# Patient Record
Sex: Male | Born: 1995 | Race: Black or African American | Hispanic: No | Marital: Single | Smoking: Never smoker
Health system: Southern US, Community
[De-identification: ages and names within clinical notes are randomized; demographics above are authoritative.]

## PROBLEM LIST (undated history)

## (undated) HISTORY — PX: KNEE SURGERY: SHX244

## (undated) HISTORY — PX: ANTERIOR CRUCIATE LIGAMENT REPAIR: SHX115

---
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
## 2015-06-08 ENCOUNTER — Emergency Department (HOSPITAL_COMMUNITY)
Admission: EM | Admit: 2015-06-08 | Discharge: 2015-06-08 | Disposition: A | Discharge status: Home / Self Care | Payer: Worker's Compensation | Attending: Emergency Medicine | Admitting: Emergency Medicine

## 2015-06-08 ENCOUNTER — Encounter (HOSPITAL_COMMUNITY): Payer: Self-pay | Admitting: *Deleted

## 2015-06-08 DIAGNOSIS — R2240 Localized swelling, mass and lump, unspecified lower limb: Secondary | ICD-10-CM | POA: Diagnosis not present

## 2015-06-08 DIAGNOSIS — M79672 Pain in left foot: Secondary | ICD-10-CM | POA: Diagnosis not present

## 2015-06-08 DIAGNOSIS — M25562 Pain in left knee: Secondary | ICD-10-CM | POA: Insufficient documentation

## 2015-06-08 MED ORDER — OXYCODONE-ACETAMINOPHEN 5-325 MG PO TABS: 1.0000 | ORAL_TABLET | ORAL | Status: AC | PRN
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
## 2015-06-08 NOTE — ED Notes (Signed)
To ED for eval of left knee pain. Pt had knee surgery on Thursday. Dr Janee Mornhompson instructed pt to return for eval of increased pain in knee, left foot and ankle swelling. Pt denies trauma

## 2015-06-08 NOTE — ED Notes (Signed)
Dr Grandville Silos met pt in triage with a pain med script and informed staff pt was not to have checked in .

## 2015-12-03 ENCOUNTER — Encounter (HOSPITAL_COMMUNITY): Payer: Self-pay | Admitting: Emergency Medicine

## 2015-12-03 ENCOUNTER — Emergency Department (HOSPITAL_COMMUNITY)
Admission: EM | Admit: 2015-12-03 | Discharge: 2015-12-03 | Disposition: A | Discharge status: Home / Self Care | Payer: Self-pay | Attending: Emergency Medicine | Admitting: Emergency Medicine

## 2015-12-03 ENCOUNTER — Emergency Department (HOSPITAL_COMMUNITY): Payer: Self-pay

## 2015-12-03 DIAGNOSIS — Y999 Unspecified external cause status: Secondary | ICD-10-CM | POA: Insufficient documentation

## 2015-12-03 DIAGNOSIS — Z79899 Other long term (current) drug therapy: Secondary | ICD-10-CM | POA: Insufficient documentation

## 2015-12-03 DIAGNOSIS — S40019A Contusion of unspecified shoulder, initial encounter: Secondary | ICD-10-CM

## 2015-12-03 DIAGNOSIS — S40012A Contusion of left shoulder, initial encounter: Secondary | ICD-10-CM | POA: Insufficient documentation

## 2015-12-03 DIAGNOSIS — Y929 Unspecified place or not applicable: Secondary | ICD-10-CM | POA: Insufficient documentation

## 2015-12-03 DIAGNOSIS — S40011A Contusion of right shoulder, initial encounter: Secondary | ICD-10-CM | POA: Insufficient documentation

## 2015-12-03 DIAGNOSIS — Y939 Activity, unspecified: Secondary | ICD-10-CM | POA: Insufficient documentation

## 2015-12-03 DIAGNOSIS — W109XXA Fall (on) (from) unspecified stairs and steps, initial encounter: Secondary | ICD-10-CM | POA: Insufficient documentation

## 2015-12-03 MED ORDER — HYDROCODONE-ACETAMINOPHEN 5-325 MG PO TABS: 1.0000 | ORAL_TABLET | ORAL | 0 refills | Status: AC | PRN

## 2015-12-03 NOTE — ED Provider Notes (Signed)
[facility]-EMERGENCY DEPT Provider Note   CSN: [id number] Arrival date & time: 12/03/15  1647     History   Chief Complaint Chief Complaint  Patient presents with  . Shoulder Pain    HPI Phillip Bradford is a 20 y.o. right handed male , in the [employer], presenting with bilateral shoulder pain since falling down 8-10 steps early yesterday morning when getting ready for his day.  He describes falling head first, "tucked and rolled" hitting bilateral shoulders on the way down but denies headache, neck pain or other injury.  Pain is constant in his shoulders, right greater than left and worsened with attempts to raise his arms over shoulder height level. He has noticed waking this morning with tingling in his right finger tips which resolved within minutes of waking and has not returned. He was seen at an urgent care center and was diagnosed with bursitis of his shoulder.  He was prescribed naproxen which has not relieved his pain.    Shoulder Pain   Pertinent negatives include no numbness.    History reviewed. No pertinent past medical history.  There are no active problems to display for this patient.   Past Surgical History:  Procedure Laterality Date  . ANTERIOR CRUCIATE LIGAMENT REPAIR    . KNEE SURGERY         Home Medications    Prior to Admission medications   Medication Sig Start Date End Date Taking? Authorizing Provider  HYDROcodone-acetaminophen (NORCO/VICODIN) 5-325 MG tablet Take 1 tablet by mouth every 4 (four) hours as needed. 12/03/15   Burgess Amor, PA-C  naphazoline-pheniramine (NAPHCON-A) 0.025-0.3 % ophthalmic solution Place 1-2 drops into both eyes 4 (four) times daily as needed. For irritation of eyes     Historical Provider, MD  oxyCODONE-acetaminophen (ROXICET) 5-325 MG tablet Take 1-3 tablets by mouth every 4 (four) hours as needed for severe pain. 06/08/15   Mack Hook, MD    Family History History reviewed. No pertinent family history.  Social  History Social History  Substance Use Topics  . Smoking status: Never Smoker  . Smokeless tobacco: Not on file  . Alcohol use No     Allergies   Review of patient's allergies indicates no known allergies.   Review of Systems Review of Systems  Constitutional: Negative for fever.  Musculoskeletal: Positive for arthralgias. Negative for joint swelling and myalgias.  Neurological: Negative for weakness and numbness.     Physical Exam Updated Vital Signs BP 175/95 (BP Location: Left Arm)   Pulse 75   Temp 98.3 F (36.8 C) (Oral)   Resp 18   Ht 5\' 9"  (1.753 m)   Wt 85.3 kg   SpO2 100%   BMI 27.76 kg/m   Physical Exam  Constitutional: He appears well-developed and well-nourished.  HENT:  Head: Atraumatic.  Neck: Normal range of motion.  Cardiovascular:  Pulses equal bilaterally  Musculoskeletal: He exhibits tenderness.       Right shoulder: He exhibits decreased range of motion and bony tenderness. He exhibits no swelling, no effusion, no crepitus, no deformity, normal pulse and normal strength.       Left shoulder: He exhibits bony tenderness. He exhibits no deformity, no spasm, normal pulse and normal strength.  Equal grip strength.  Sensation normal in bilateral arms.  ttp bilateral lateral shoulders.  nontender clavicles and scapulae.   No visible deformity.  Pt has full strength with forearm flex/ext.   Neurological: He is alert. He has normal strength. He displays normal  reflexes. No sensory deficit.  Reflex Scores:      Bicep reflexes are 2+ on the right side and 2+ on the left side. Equal grip strength  Skin: Skin is warm and dry.  Psychiatric: He has a normal mood and affect.     ED Treatments / Results  Labs (all labs ordered are listed, but only abnormal results are displayed) Labs Reviewed - No data to display  EKG  EKG Interpretation None       Radiology Dg Shoulder Right  Result Date: 12/03/2015 CLINICAL DATA:  Bilateral shoulder pain.   Recent fall. EXAM: RIGHT SHOULDER - 2+ VIEW COMPARISON:  Left shoulder 12/03/2015 FINDINGS: There is no evidence of fracture or dislocation. There is no evidence of arthropathy or other focal bone abnormality. Soft tissues are unremarkable. IMPRESSION: Negative. Electronically Signed   By: Richarda OverlieAdam  Henn M.D.   On: 12/03/2015 18:18   Dg Shoulder Left  Result Date: 12/03/2015 CLINICAL DATA:  Pain just over 1 week ago. EXAM: LEFT SHOULDER - 2+ VIEW COMPARISON:  None. FINDINGS: There is no evidence of fracture or dislocation. There is no evidence of arthropathy or other focal bone abnormality. Soft tissues are unremarkable. IMPRESSION: Negative. Electronically Signed   By: Gerome Samavid  Eifert III M.D   On: 12/03/2015 18:18    Procedures Procedures (including critical care time)  Medications Ordered in ED Medications - No data to display   Initial Impression / Assessment and Plan / ED Course  I have reviewed the triage vital signs and the nursing notes.  Pertinent labs & imaging results that were available during my care of the patient were reviewed by me and considered in my medical decision making (see chart for details).  Clinical Course    Imaging reviewed and discussed with pt.  Suspect contusion/soft tissue injury.  He was advised to use ice tx, adding heat on day 3.  Sling prn.  Hydrocodone. F/u with Dr. Luiz BlareGraves prn if sx not improving over the next 7-10 days.  Final Clinical Impressions(s) / ED Diagnoses   Final diagnoses:  Shoulder contusion, unspecified laterality, initial encounter    New Prescriptions New Prescriptions   HYDROCODONE-ACETAMINOPHEN (NORCO/VICODIN) 5-325 MG TABLET    Take 1 tablet by mouth every 4 (four) hours as needed.     Burgess AmorJulie Kayana Thoen, PA-C 12/03/15 1857    Shaune Pollackameron Isaacs, MD 12/04/15 [phone]

## 2015-12-03 NOTE — Discharge Instructions (Signed)
Apply ice packs to your shoulders as much as possible for the next several days (5 minutes every hour while awake would be a good schedule).  You may add heat therapy starting on Thursday for 20 minutes twice daily.  Continue using your naproxen.  You may take the hydrocodone prescribed for pain relief.  This will make you drowsy - do not drive within 4 hours of taking this medication.  Wear the sling for comfort as discussed.

## 2015-12-03 NOTE — ED Triage Notes (Signed)
[facility].  Pt injured shoulders last Monday after falling. Pt had dx bursitis in right shoulder.

## 2017-11-09 IMAGING — DX DG SHOULDER 2+V*L*
3 series · 4 of 4 positions shown · non-contrast
Comparison: None.

CLINICAL DATA: Pain just over 1 week ago.

EXAM:
LEFT SHOULDER - 2+ VIEW

[shoulder grashey]
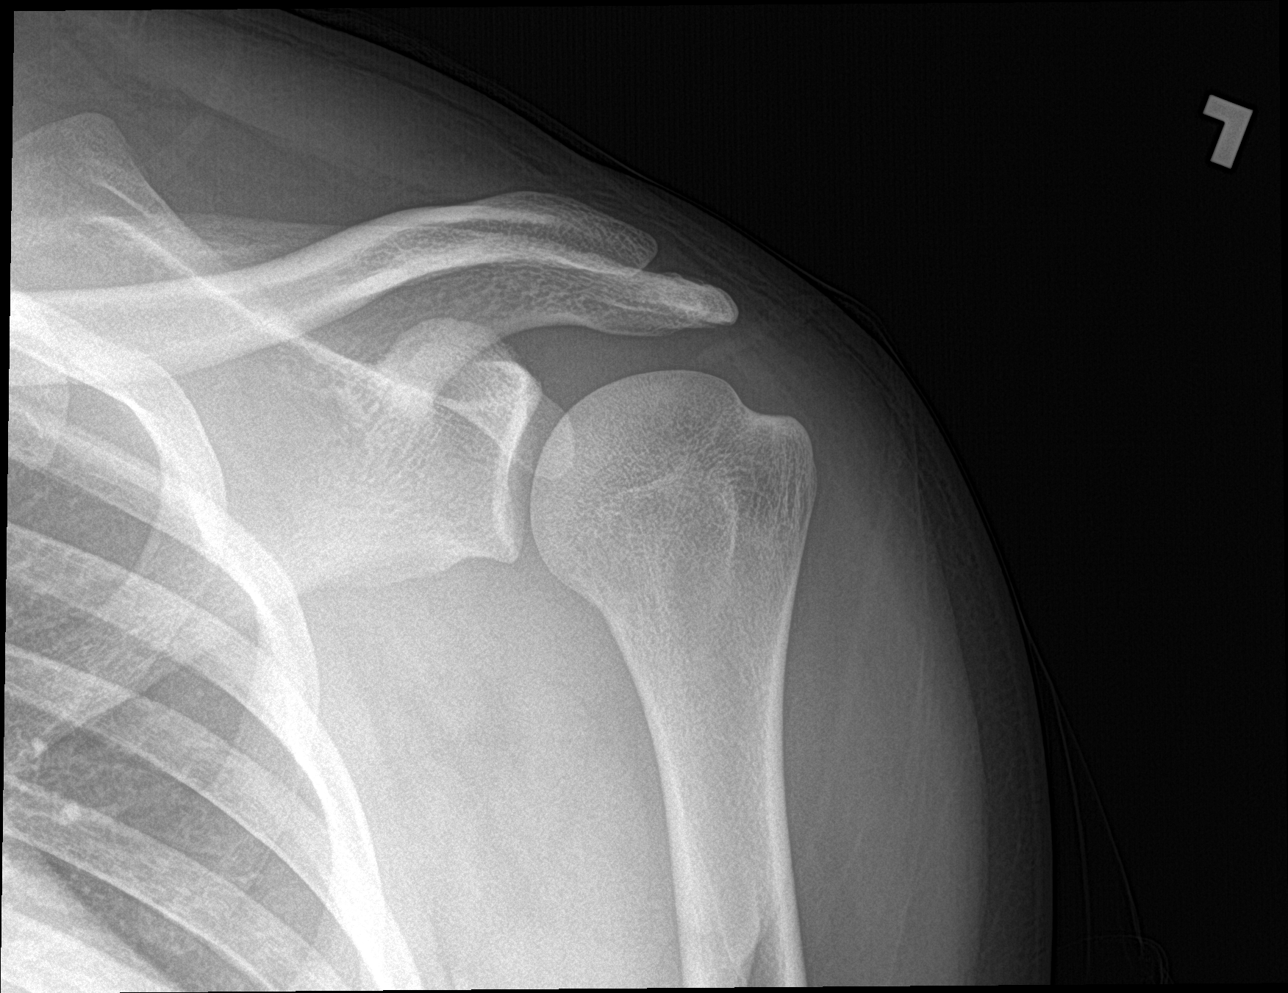

[Series 2: shoulder y view · 0.14mm/px · 2 of 2 slices shown]
[im 1/2]
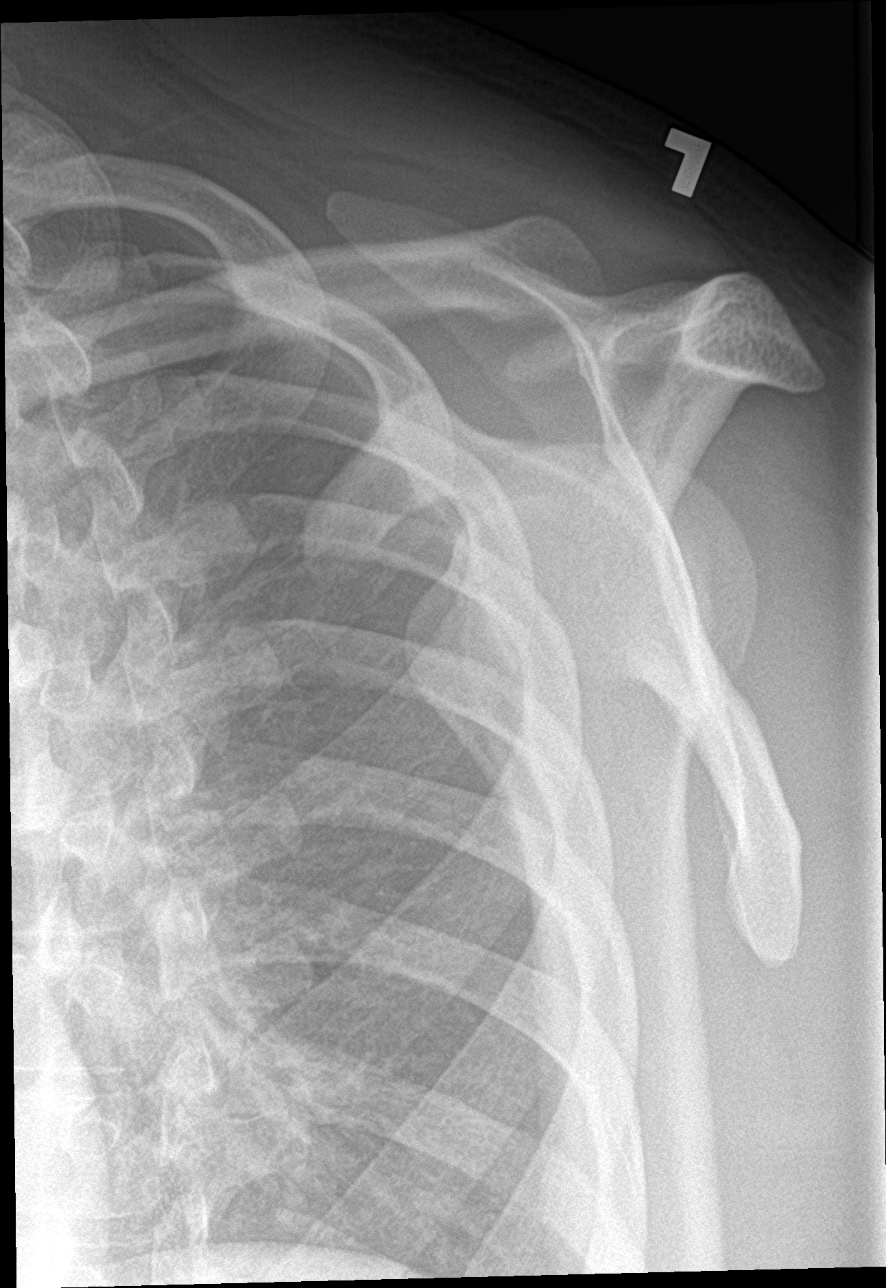
[im 2/2]
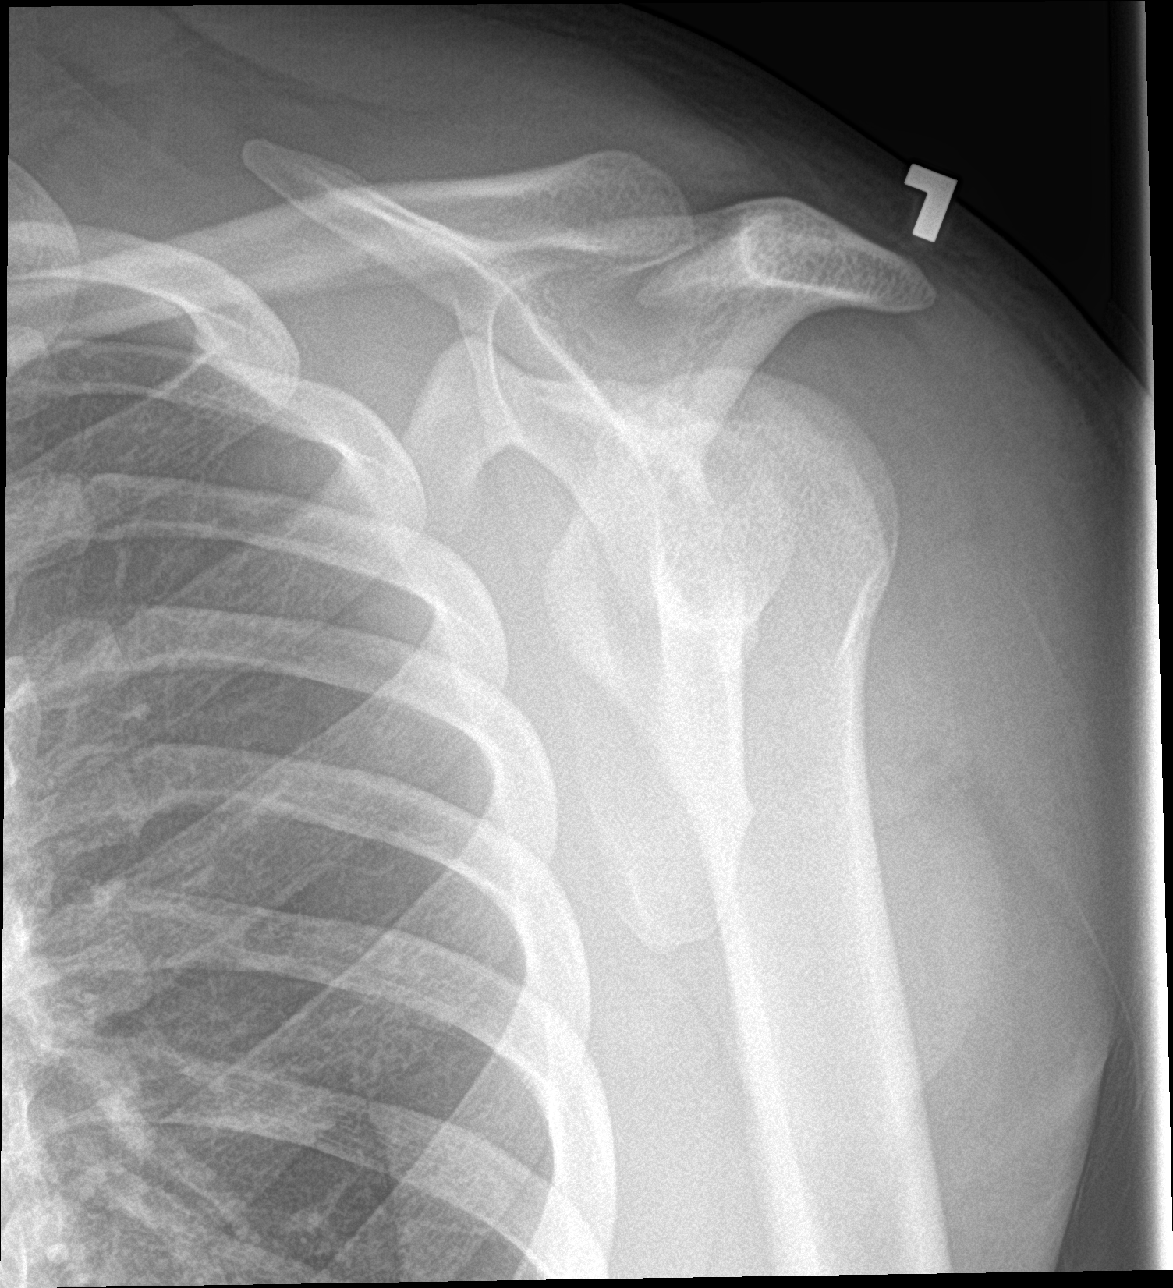

[shoulder axillary]
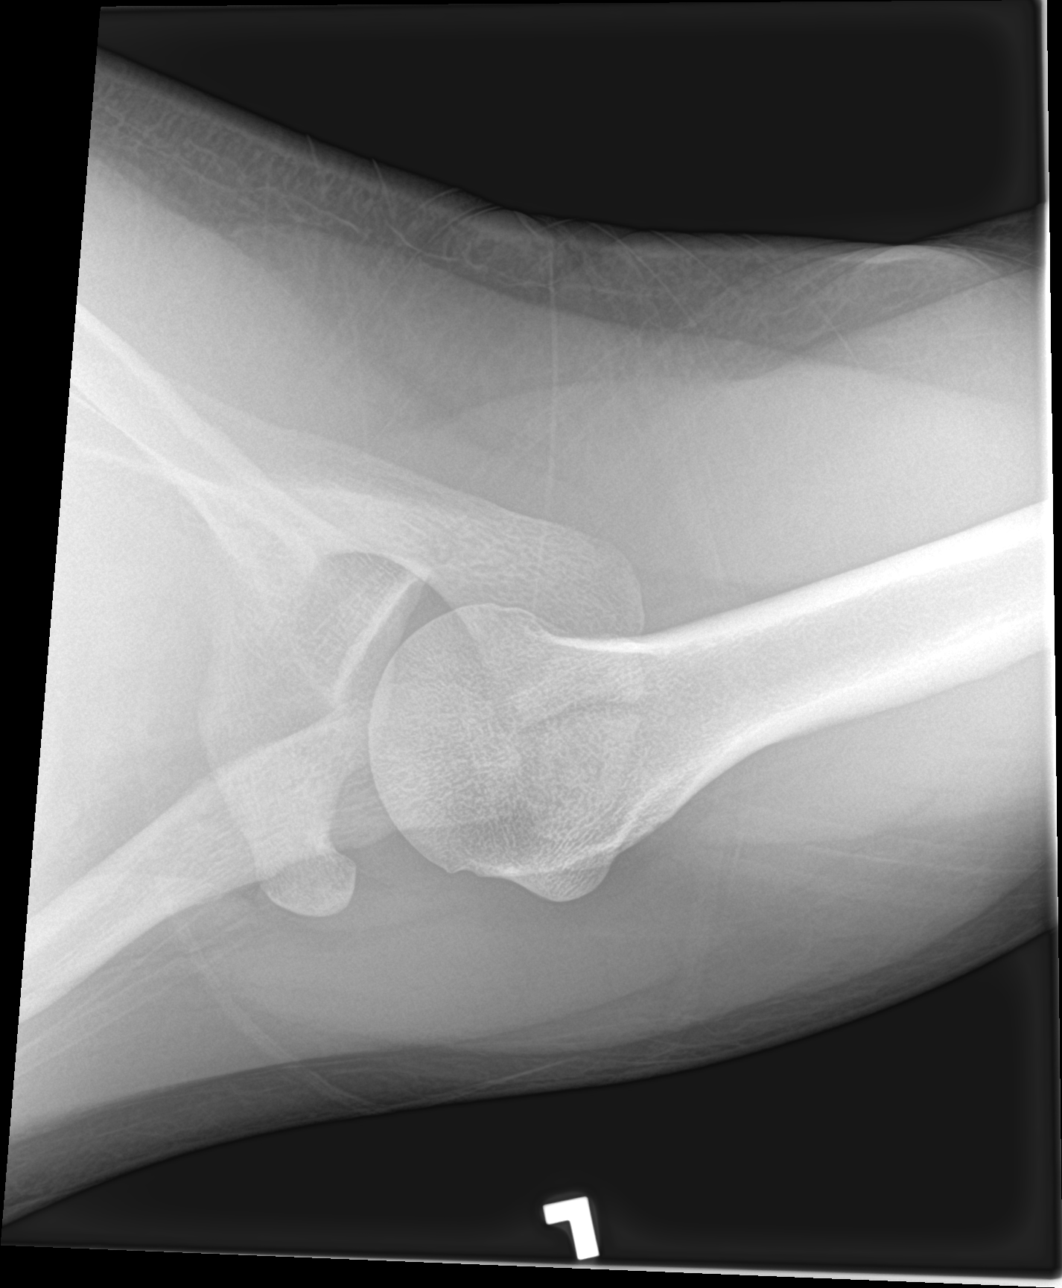

[4 of 4 positions shown; findings below may reference images not displayed]

FINDINGS: There is no evidence of fracture or dislocation. There is no
evidence of arthropathy or other focal bone abnormality. Soft
tissues are unremarkable.
IMPRESSION: Negative.

## 2017-11-12 DIAGNOSIS — H5213 Myopia, bilateral: Secondary | ICD-10-CM | POA: Diagnosis not present

## 2017-11-12 DIAGNOSIS — H52223 Regular astigmatism, bilateral: Secondary | ICD-10-CM | POA: Diagnosis not present
